# Patient Record
Sex: Female | Born: 1980
Health system: Southern US, Community
[De-identification: ages and names within clinical notes are randomized; demographics above are authoritative.]

## PROBLEM LIST (undated history)

## (undated) DIAGNOSIS — D649 Anemia, unspecified: Secondary | ICD-10-CM

## (undated) HISTORY — PX: LIPOSUCTION: SHX10

## (undated) HISTORY — DX: Anemia, unspecified: D64.9

---

## 2013-04-03 DIAGNOSIS — Z833 Family history of diabetes mellitus: Secondary | ICD-10-CM | POA: Insufficient documentation

## 2018-11-25 ENCOUNTER — Other Ambulatory Visit (HOSPITAL_COMMUNITY)
Admission: RE | Admit: 2018-11-25 | Discharge: 2018-11-25 | Disposition: A | Discharge status: Home / Self Care | Payer: BLUE CROSS/BLUE SHIELD | Source: Ambulatory Visit | Attending: Family Medicine | Admitting: Family Medicine

## 2018-11-25 ENCOUNTER — Ambulatory Visit (INDEPENDENT_AMBULATORY_CARE_PROVIDER_SITE_OTHER): Payer: BLUE CROSS/BLUE SHIELD | Admitting: Family Medicine

## 2018-11-25 ENCOUNTER — Other Ambulatory Visit: Payer: Self-pay | Admitting: *Deleted

## 2018-11-25 ENCOUNTER — Encounter: Payer: Self-pay | Admitting: Family Medicine

## 2018-11-25 ENCOUNTER — Other Ambulatory Visit: Payer: Self-pay

## 2018-11-25 VITALS — BP 128/80 | HR 73 | Temp 98.1°F | Resp 16 | Ht 65.0 in | Wt 211.6 lb

## 2018-11-25 DIAGNOSIS — Z0000 Encounter for general adult medical examination without abnormal findings: Secondary | ICD-10-CM | POA: Insufficient documentation

## 2018-11-25 DIAGNOSIS — Z124 Encounter for screening for malignant neoplasm of cervix: Secondary | ICD-10-CM | POA: Insufficient documentation

## 2018-11-25 DIAGNOSIS — Z23 Encounter for immunization: Secondary | ICD-10-CM

## 2018-11-25 LAB — COMPREHENSIVE METABOLIC PANEL
ALT: 14 U/L (ref 0–35)
AST: 16 U/L (ref 0–37)
Albumin: 4 g/dL (ref 3.5–5.2)
Alkaline Phosphatase: 55 U/L (ref 39–117)
BUN: 10 mg/dL (ref 6–23)
CO2: 28 mEq/L (ref 19–32)
Calcium: 9.2 mg/dL (ref 8.4–10.5)
Chloride: 103 mEq/L (ref 96–112)
Creatinine, Ser: 0.77 mg/dL (ref 0.40–1.20)
GFR: 101.76 mL/min (ref >60.00)
Glucose, Bld: 88 mg/dL (ref 70–99)
Potassium: 4 mEq/L (ref 3.5–5.1)
Sodium: 136 mEq/L (ref 135–145)
Total Bilirubin: 0.4 mg/dL (ref 0.2–1.2)
Total Protein: 7.2 g/dL (ref 6.0–8.3)

## 2018-11-25 LAB — CBC WITH DIFFERENTIAL/PLATELET
Basophils Absolute: 0.1 10*3/uL (ref 0.0–0.1)
Basophils Relative: 1.1 % (ref 0.0–3.0)
Eosinophils Absolute: 0.1 10*3/uL (ref 0.0–0.7)
Eosinophils Relative: 2.6 % (ref 0.0–5.0)
HCT: 37.6 % (ref 36.0–46.0)
Hemoglobin: 12.5 g/dL (ref 12.0–15.0)
Lymphocytes Relative: 28.5 % (ref 12.0–46.0)
Lymphs Abs: 1.6 10*3/uL (ref 0.7–4.0)
MCHC: 33.2 g/dL (ref 30.0–36.0)
MCV: 87.4 fl (ref 78.0–100.0)
Monocytes Absolute: 0.7 10*3/uL (ref 0.1–1.0)
Monocytes Relative: 11.7 % (ref 3.0–12.0)
Neutro Abs: 3.2 10*3/uL (ref 1.4–7.7)
Neutrophils Relative %: 56.1 % (ref 43.0–77.0)
Platelets: 267 10*3/uL (ref 150.0–400.0)
RBC: 4.3 Mil/uL (ref 3.87–5.11)
RDW: 15.1 % (ref 11.5–15.5)
WBC: 5.7 10*3/uL (ref 4.0–10.5)

## 2018-11-25 LAB — LIPID PANEL
Cholesterol: 171 mg/dL (ref 0–200)
HDL: 57.2 mg/dL (ref >39.00)
LDL Cholesterol: 94 mg/dL (ref 0–99)
NonHDL: 113.69
Total CHOL/HDL Ratio: 3
Triglycerides: 100 mg/dL (ref 0.0–149.0)
VLDL: 20 mg/dL (ref 0.0–40.0)

## 2018-11-25 LAB — TSH: TSH: 1.03 u[IU]/mL (ref 0.35–4.50)

## 2018-11-25 NOTE — Patient Instructions (Signed)
It was so good seeing you again! Thank you for establishing with my new practice and allowing me to continue caring for you. It means a lot to me.   Please schedule a follow up appointment with me in 12 months for your annual complete physical; please come fasting.  Recommendations for women to keep healthy:   EXERCISE AND DIET: We recommended that you start or continue a regular exercise program for good health. Regular exercise means any activity that makes your heart beat faster and makes you sweat. We recommend exercising at least 30 minutes per day at least 3 days a week, preferably 4 or 5. We also recommend a diet low in fat and sugar. Inactivity, poor dietary choices and obesity can cause diabetes, heart attack, stroke, and kidney damage, among others.   ALCOHOL AND SMOKING: Women should limit their alcohol intake to no more than 7 drinks/beers/glasses of wine (combined, not each!) per week. Moderation of alcohol intake to this level decreases your risk of breast cancer and liver damage. And of course, no recreational drugs are part of a healthy lifestyle. And absolutely no smoking or even second hand smoke. Most people know smoking can cause heart and lung diseases, but did you know it also contributes to weakening of your bones? Aging of your skin? Yellowing of your teeth and nails?  CALCIUM AND VITAMIN D: Adequate intake of calcium and Vitamin D are recommended. The recommendations for exact amounts of these supplements seem to change often, but generally speaking 600 mg of calcium (either carbonate or citrate) and 800 units of Vitamin D per day seems prudent. Certain women may benefit from higher intake of Vitamin D. If you are among these women, your doctor will have told you during your visit.   PAP SMEARS: Pap smears, to check for cervical cancer or precancers, have traditionally been done yearly, although recent scientific advances have shown that most women can have pap smears less often.  However, every woman still should have a physical exam from her gynecologist every year. It will include a breast check, inspection of the vulva and vagina to check for abnormal growths or skin changes, a visual exam of the cervix, and then an exam to evaluate the size and shape of the uterus and ovaries. And after 38 years of age, a rectal exam is indicated to check for rectal cancers. We will also provide age appropriate advice regarding health maintenance, like when you should have certain vaccines, screening for sexually transmitted diseases, bone density testing, colonoscopy, mammograms, etc.   MAMMOGRAMS: All women over 53 years old should have a yearly mammogram. Many facilities now offer a "3D" mammogram, which may cost around $50 extra out of pocket. If possible, we recommend you accept the option to have the 3D mammogram performed. It both reduces the number of women who will be called back for extra views which then turn out to be normal, and it is better than the routine mammogram at detecting truly abnormal areas.   COLONOSCOPY: Colonoscopy to screen for colon cancer is recommended for all women at age 32. We know, you hate the idea of the prep. We agree, BUT, having colon cancer and not knowing it is worse!! Colon cancer so often starts as a polyp that can be seen and removed at colonscopy, which can quite literally save your life! And if your first colonoscopy is normal and you have no family history of colon cancer, most women don't have to have it again for  10 years. Once every ten years, you can do something that may end up saving your life, right? We will be happy to help you get it scheduled when you are ready. Be sure to check your insurance coverage so you understand how much it will cost. It may be covered as a preventative service at no cost, but you should check your particular policy.
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
## 2018-11-27 LAB — HIV ANTIBODY (ROUTINE TESTING W REFLEX): HIV 1&2 Ab, 4th Generation: NONREACTIVE

## 2018-12-01 LAB — CYTOLOGY - PAP
Diagnosis: NEGATIVE
HPV 16/18/45 genotyping: NEGATIVE
HPV: DETECTED — AB

## 2018-12-01 NOTE — Progress Notes (Signed)
Please call patient: I have reviewed his/her lab results. Please let her know that all of her blood test results and pap smear results are normal.   (Nl pap and neg HR HPV. Recheck 5 years. )

## 2019-12-12 ENCOUNTER — Encounter: Payer: Self-pay | Admitting: Family Medicine

## 2019-12-13 NOTE — Telephone Encounter (Signed)
LVM for patient to call back and schedule a Weight loss opt with Dr. Mardelle Matte

## 2019-12-25 ENCOUNTER — Telehealth: Payer: Self-pay | Admitting: Family Medicine

## 2019-12-25 NOTE — Telephone Encounter (Signed)
Patient calling, Has an appointment for a physical on 02/21/2020 but would like to ask the nurse about some weight loss issues. Please Advise

## 2019-12-26 NOTE — Telephone Encounter (Signed)
Patient has been scheduled

## 2019-12-26 NOTE — Telephone Encounter (Signed)
Please schedule patient for 11 AM on 01/12/20. Patient is aware of appt

## 2020-01-12 ENCOUNTER — Other Ambulatory Visit: Payer: Self-pay

## 2020-01-12 ENCOUNTER — Ambulatory Visit (INDEPENDENT_AMBULATORY_CARE_PROVIDER_SITE_OTHER): Payer: Managed Care, Other (non HMO) | Admitting: Family Medicine

## 2020-01-12 ENCOUNTER — Encounter: Payer: Self-pay | Admitting: Family Medicine

## 2020-01-12 VITALS — BP 128/80 | HR 83 | Temp 98.2°F | Resp 15 | Ht 65.0 in | Wt 216.6 lb

## 2020-01-12 DIAGNOSIS — E669 Obesity, unspecified: Secondary | ICD-10-CM | POA: Insufficient documentation

## 2020-01-12 MED ORDER — PHENTERMINE HCL 37.5 MG PO TABS: 37.5000 mg | ORAL_TABLET | Freq: Every day | ORAL | 2 refills | Status: DC

## 2020-01-12 NOTE — Progress Notes (Signed)
Subjective  CC:  Chief Complaint  Patient presents with  . weight management    interested in starting Qsymia    HPI: Ashley Bartlett is a 39 y.o. female who presents to the office today to address the problems listed above in the chief complaint.  Very pleasant 39 yo struggling with weight loss. 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 Has gained a few pounds. Now is home and taking some time off to recover from the stress/work. Ready to work on self care and weight loss. Would like to start a medication to help. Has used phentermine in past with success. Friend has used qsymia with some success as well. No HTN or DM and wants to avoid getting these problems. Starting with some protein meal replacement shakes; starting to exercise at [employer] 3x/week.    Wt Readings from Last 3 Encounters:  01/12/20 216 lb 9.6 oz (98.2 kg)  11/25/18 211 lb 9.6 oz (96 kg)    Assessment  1. Obesity (BMI 30-39.9)      Plan   obesity:  Discussed nutrition, exercise goals, and starting meds: elect phentermine 37.5. has f/u in 6 weeks for cpe. Will recheck weight loss and tolerance then. Discussed risks/benefits and expectations.   Follow up: Return for as scheduled.  02/21/2020  No orders of the defined types were placed in this encounter.  Meds ordered this encounter  Medications  . phentermine (ADIPEX-P) 37.5 MG tablet    Sig: Take 1 tablet (37.5 mg total) by mouth daily before breakfast.    Dispense:  30 tablet    Refill:  2      I reviewed the patients updated PMH, FH, and SocHx.    Patient Active Problem List   Diagnosis Date Noted  . Obesity (BMI 30-39.9) 01/12/2020  . Family history of diabetes mellitus in father 04/03/2013   No outpatient medications have been marked as taking for the 01/12/20 encounter (Office Visit) with Leamon Arnt, MD.    Allergies: Patient has No Known Allergies. Family History: Patient family history includes Asthma in her  sister; Breast cancer (age of onset: 41) in her sister; Diabetes in her father; Diabetes type I in her daughter; Healthy in her brother, brother, brother, daughter, and sister; Hepatitis C in her mother; Hypertension in her father, mother, and sister. Social History:  Patient  reports that she has never smoked. She has never used smokeless tobacco. She reports that she does not drink alcohol or use drugs.  Review of Systems: Constitutional: Negative for fever malaise or anorexia Cardiovascular: negative for chest pain Respiratory: negative for SOB or persistent cough Gastrointestinal: negative for abdominal pain  Objective  Vitals: BP 128/80   Pulse 83   Temp 98.2 F (36.8 C) (Temporal)   Resp 15   Ht 5\' 5"  (1.651 m)   Wt 216 lb 9.6 oz (98.2 kg)   SpO2 98%   BMI 36.04 kg/m  General: no acute distress , A&Ox3   Commons side effects, risks, benefits, and alternatives for medications and treatment plan prescribed today were discussed, and the patient expressed understanding of the given instructions. Patient is instructed to call or message via MyChart if he/she has any questions or concerns regarding our treatment plan. No barriers to understanding were identified. We discussed Red Flag symptoms and signs in detail. Patient expressed understanding regarding what to do in case of urgent or emergency type symptoms.   Medication list was reconciled,  printed and provided to the patient in AVS. Patient instructions and summary information was reviewed with the patient as documented in the AVS. This note was prepared with assistance of Dragon voice recognition software. Occasional wrong-word or sound-a-like substitutions may have occurred due to the inherent limitations of voice recognition software  This visit occurred during the SARS-CoV-2 public health emergency.  Safety protocols were in place, including screening questions prior to the visit, additional usage of staff PPE, and extensive  cleaning of exam room while observing appropriate contact time as indicated for disinfecting solutions.

## 2020-01-12 NOTE — Patient Instructions (Addendum)
Please follow up as scheduled for your next visit with me: 02/21/2020   If you have any questions or concerns, please don't hesitate to send me a message via MyChart or call the office at [phone]. Thank you for visiting with [place] today! It's our pleasure caring for you.  Start phentermine for appetite suppression; start working on changing behaviors around food: shopping, meal prep, planning ahead and eating mostly plants. Avoid processed foods as much as possible. Drink water.

## 2020-02-21 ENCOUNTER — Other Ambulatory Visit (HOSPITAL_COMMUNITY)
Admission: RE | Admit: 2020-02-21 | Discharge: 2020-02-21 | Disposition: A | Discharge status: Home / Self Care | Payer: Managed Care, Other (non HMO) | Source: Ambulatory Visit | Attending: Family Medicine | Admitting: Family Medicine

## 2020-02-21 ENCOUNTER — Other Ambulatory Visit: Payer: Self-pay

## 2020-02-21 ENCOUNTER — Ambulatory Visit (INDEPENDENT_AMBULATORY_CARE_PROVIDER_SITE_OTHER): Payer: Managed Care, Other (non HMO) | Admitting: Family Medicine

## 2020-02-21 ENCOUNTER — Encounter: Payer: Self-pay | Admitting: Family Medicine

## 2020-02-21 VITALS — BP 118/72 | HR 101 | Temp 97.2°F | Resp 18 | Ht 65.0 in | Wt 215.6 lb

## 2020-02-21 DIAGNOSIS — Z1159 Encounter for screening for other viral diseases: Secondary | ICD-10-CM | POA: Diagnosis not present

## 2020-02-21 DIAGNOSIS — Z0000 Encounter for general adult medical examination without abnormal findings: Secondary | ICD-10-CM | POA: Diagnosis not present

## 2020-02-21 DIAGNOSIS — Z113 Encounter for screening for infections with a predominantly sexual mode of transmission: Secondary | ICD-10-CM

## 2020-02-21 DIAGNOSIS — E669 Obesity, unspecified: Secondary | ICD-10-CM | POA: Diagnosis not present

## 2020-02-21 DIAGNOSIS — Z833 Family history of diabetes mellitus: Secondary | ICD-10-CM | POA: Diagnosis not present

## 2020-02-21 DIAGNOSIS — E049 Nontoxic goiter, unspecified: Secondary | ICD-10-CM

## 2020-02-21 LAB — COMPREHENSIVE METABOLIC PANEL
ALT: 16 U/L (ref 0–35)
AST: 14 U/L (ref 0–37)
Albumin: 4 g/dL (ref 3.5–5.2)
Alkaline Phosphatase: 58 U/L (ref 39–117)
BUN: 7 mg/dL (ref 6–23)
CO2: 26 mEq/L (ref 19–32)
Calcium: 8.8 mg/dL (ref 8.4–10.5)
Chloride: 106 mEq/L (ref 96–112)
Creatinine, Ser: 0.69 mg/dL (ref 0.40–1.20)
GFR: 114.73 mL/min (ref >60.00)
Glucose, Bld: 83 mg/dL (ref 70–99)
Potassium: 4.1 mEq/L (ref 3.5–5.1)
Sodium: 138 mEq/L (ref 135–145)
Total Bilirubin: 0.3 mg/dL (ref 0.2–1.2)
Total Protein: 6.9 g/dL (ref 6.0–8.3)

## 2020-02-21 LAB — CBC WITH DIFFERENTIAL/PLATELET
Basophils Absolute: 0 10*3/uL (ref 0.0–0.1)
Basophils Relative: 0.5 % (ref 0.0–3.0)
Eosinophils Absolute: 0.1 10*3/uL (ref 0.0–0.7)
Eosinophils Relative: 2.5 % (ref 0.0–5.0)
HCT: 35.5 % — ABNORMAL LOW (ref 36.0–46.0)
Hemoglobin: 11.7 g/dL — ABNORMAL LOW (ref 12.0–15.0)
Lymphocytes Relative: 27.7 % (ref 12.0–46.0)
Lymphs Abs: 1.5 10*3/uL (ref 0.7–4.0)
MCHC: 32.9 g/dL (ref 30.0–36.0)
MCV: 85.9 fl (ref 78.0–100.0)
Monocytes Absolute: 0.5 10*3/uL (ref 0.1–1.0)
Monocytes Relative: 10.5 % (ref 3.0–12.0)
Neutro Abs: 3.1 10*3/uL (ref 1.4–7.7)
Neutrophils Relative %: 58.8 % (ref 43.0–77.0)
Platelets: 296 10*3/uL (ref 150.0–400.0)
RBC: 4.14 Mil/uL (ref 3.87–5.11)
RDW: 15.4 % (ref 11.5–15.5)
WBC: 5.2 10*3/uL (ref 4.0–10.5)

## 2020-02-21 LAB — LIPID PANEL
Cholesterol: 157 mg/dL (ref 0–200)
HDL: 54.3 mg/dL (ref >39.00)
LDL Cholesterol: 87 mg/dL (ref 0–99)
NonHDL: 102.45
Total CHOL/HDL Ratio: 3
Triglycerides: 79 mg/dL (ref 0.0–149.0)
VLDL: 15.8 mg/dL (ref 0.0–40.0)

## 2020-02-21 LAB — TSH: TSH: 1.44 u[IU]/mL (ref 0.35–4.50)

## 2020-02-21 LAB — HEMOGLOBIN A1C: Hgb A1c MFr Bld: 5.6 % (ref 4.6–6.5)

## 2020-02-21 NOTE — Patient Instructions (Addendum)
Please return in 12 months for your annual complete physical; please come fasting.  I will release your lab results to you on your MyChart account with further instructions. Please reply with any questions.   If you have any questions or concerns, please don't hesitate to send me a message via MyChart or call the office at [phone]. Thank you for visiting with [place] today! It's our pleasure caring for you.  We will call you to get your thyroid ultrasound scheduled. I'd like to ensure it is normal size and without nodules.   Please do these things to maintain good health!   Exercise at least 30-45 minutes a day,  4-5 days a week.   Eat a low-fat diet with lots of fruits and vegetables, up to 7-9 servings per day.  Drink plenty of water daily. Try to drink 8 8oz glasses per day.  Seatbelts can save your life. Always wear your seatbelt.  Place Smoke Detectors on every level of your home and check batteries every year.  Schedule an appointment with an eye doctor for an eye exam every 1-2 years  Safe sex - use condoms to protect yourself from STDs if you could be exposed to these types of infections. Use birth control if you do not want to become pregnant and are sexually active.  Avoid heavy alcohol use. If you drink, keep it to less than 2 drinks/day and not every day.  Health Care Power of Attorney.  Choose someone you trust that could speak for you if you became unable to speak for yourself.  Depression is common in our stressful world.If you're feeling down or losing interest in things you normally enjoy, please come in for a visit.  If anyone is threatening or hurting you, please get help. Physical or Emotional Violence is never OK.

## 2020-02-21 NOTE — Progress Notes (Signed)
Subjective  Chief Complaint  Patient presents with  . Annual Exam    Fasting labs, she would like to test to see if she has the covid antibodies  . Health Maintenance    She would like to get her Tdap vaccine     HPI: Ashley Bartlett is a 39 y.o. female who presents to [facility] [place] today for a Female Wellness Visit.  She also has the concerns and/or needs as listed above in the chief complaint. These will be addressed in addition to the Health Maintenance Visit.   Wellness Visit: annual visit with health maintenance review and exam without Pap   HM: overall doing well.  Pap smear screening is up-to-date.  Immunizations are up-to-date.  She is not due for Tdap but she had her last one last year. Chronic disease management visit and/or acute problem visit:  Obesity: Tolerating phentermine very well.  Started about 6 weeks ago.  No adverse side effects.  Weight is down 1 to 2 pounds and she is happy with that.  Eating a healthy diet and exercising regularly.  Family history of diabetes: No symptoms of hyperglycemia present  Sexually active, using condoms for birth control.  Declines other forms of birth control at this time.  Would like to have STD screening done.  No known exposures.  No symptoms.  Menstrual cycles are regular  Wt Readings from Last 3 Encounters:  02/21/20 215 lb 9.6 oz (97.8 kg)  01/12/20 216 lb 9.6 oz (98.2 kg)  11/25/18 211 lb 9.6 oz (96 kg)    Assessment  1. Annual physical exam   2. Obesity (BMI 30-39.9)   3. Family history of diabetes mellitus in father   72. Need for hepatitis C screening test   5. Screen for STD (sexually transmitted disease)   6. Enlarged thyroid      Plan  Female Wellness Visit:  Age appropriate Health Maintenance and Prevention measures were discussed with patient. Included topics are cancer screening recommendations, ways to keep healthy (see AVS) including dietary and exercise recommendations, regular  eye and dental care, use of seat belts, and avoidance of moderate alcohol use and tobacco use.   BMI: discussed patient's BMI and encouraged positive lifestyle modifications to help get to or maintain a target BMI.  HM needs and immunizations were addressed and ordered. See below for orders. See HM and immunization section for updates.  Routine labs and screening tests ordered including cmp, cbc and lipids where appropriate.  Discussed recommendations regarding Vit D and calcium supplementation (see AVS)  Chronic disease f/u and/or acute problem visit: (deemed necessary to be done in addition to the wellness visit):  Obesity: Continue phentermine.  Continue healthy diet and exercise  Screen for STDs: Safe sex and birth control options discussed  Screen for diabetes  Possibly enlarged thyroid on clinical exam without symptoms: Check ultrasound  Follow up: 12 months for your complete annual physical exam with blood work. Please come fasting.  Orders Placed This Encounter  Procedures  . US THYROID  . CBC with Differential/Platelet  . Comprehensive metabolic panel  . Lipid panel  . TSH  . Hemoglobin A1c  . Hepatitis C antibody  . HIV Antibody (routine testing w rflx)  . RPR   No orders of the defined types were placed in this encounter.     Lifestyle: Body mass index is 35.88 kg/m. Wt Readings from Last 3 Encounters:  02/21/20 215 lb 9.6 oz (97.8 kg)  01/12/20 216 lb 9.6 oz (98.2 kg)  11/25/18 211 lb 9.6 oz (96 kg)    Patient Active Problem List   Diagnosis Date Noted  . Obesity (BMI 30-39.9) 01/12/2020  . Family history of diabetes mellitus in father 04/03/2013   Health Maintenance  Topic Date Due  . Hepatitis C Screening  Never done  . INFLUENZA VACCINE  03/10/2020  . PAP SMEAR-Modifier  11/25/2023  . TETANUS/TDAP  11/24/2028  . COVID-19 Vaccine  Completed  . HIV Screening  Completed   Immunization History  Administered Date(s) Administered  . PFIZER  SARS-COV-2 Vaccination 11/08/2019, 12/06/2019  . PPD Test 04/03/2013, 08/29/2014  . Tdap 11/09/2011, 11/25/2018   We updated and reviewed the patient's past history in detail and it is documented below. Allergies: Patient  reports no history of alcohol use. Past Medical History Patient  has a past medical history of Anemia. Past Surgical History Patient  has a past surgical history that includes Liposuction. Social History   Socioeconomic History  . Marital status: Divorced    Spouse name: Not on file  . Number of children: 2  . Years of education: Not on file  . Highest education level: Not on file  Occupational History  . Occupation: Designer, jewellery    Comment: works in [place], [place]  Tobacco Use  . Smoking status: Never Smoker  . Smokeless tobacco: Never Used  Vaping Use  . Vaping Use: Never used  Substance and Sexual Activity  . Alcohol use: Never  . Drug use: Never  . Sexual activity: Not Currently    Comment: has used mirena, ocps, paraguard in past  Other Topics Concern  . Not on file  Social History Narrative  . Not on file   Social Determinants of Health   Financial Resource Strain:   . Difficulty of Paying Living Expenses:   Food Insecurity:   . Worried About Programme researcher, broadcasting/film/video in the Last Year:   . Barista in the Last Year:   Transportation Needs:   . Freight forwarder (Medical):   Marland Kitchen Lack of Transportation (Non-Medical):   Physical Activity:   . Days of Exercise per Week:   . Minutes of Exercise per Session:   Stress:   . Feeling of Stress :   Social Connections:   . Frequency of Communication with Friends and Family:   . Frequency of Social Gatherings with Friends and Family:   . Attends Religious Services:   . Active Member of Clubs or Organizations:   . Attends Banker Meetings:   Marland Kitchen Marital Status:    Family History  Problem Relation Age of Onset  . Hypertension Mother   . Hepatitis C Mother   . Diabetes Father     . Hypertension Father   . Hypertension Sister   . Healthy Brother   . Healthy Daughter   . Diabetes type I Daughter   . Healthy Sister   . Breast cancer Sister 62  . Asthma Sister   . Healthy Brother   . Healthy Brother     Review of Systems: Constitutional: negative for fever or malaise Ophthalmic: negative for photophobia, double vision or loss of vision Cardiovascular: negative for chest pain, dyspnea on exertion, or new LE swelling Respiratory: negative for SOB or persistent cough Gastrointestinal: negative for abdominal pain, change in bowel habits or melena Genitourinary: negative for dysuria or gross hematuria, no abnormal uterine bleeding or disharge Musculoskeletal: negative for new gait disturbance or  muscular weakness Integumentary: negative for new or persistent rashes, no breast lumps Neurological: negative for TIA or stroke symptoms Psychiatric: negative for SI or delusions Allergic/Immunologic: negative for hives  Patient Care Team    Relationship Specialty Notifications Start End  Willow Ora, MD PCP - General Family Medicine  11/25/18     Objective  Vitals: BP 118/72   Pulse (!) 101   Temp (!) 97.2 F (36.2 C) (Temporal)   Resp 18   Ht 5\' 5"  (1.651 m)   Wt 215 lb 9.6 oz (97.8 kg)   SpO2 97%   BMI 35.88 kg/m  General:  Well developed, well nourished, no acute distress  Psych:  Alert and orientedx3,normal mood and affect HEENT:  Normocephalic, atraumatic, non-icteric sclera, PERRL, supple neck without adenopathy, mass + generalized thyromegaly w/o nodule or ttp Cardiovascular:  Normal S1, S2, RRR without gallop, rub or murmur Respiratory:  Good breath sounds bilaterally, CTAB with normal respiratory effort Gastrointestinal: normal bowel sounds, soft, non-tender, no noted masses. No HSM MSK: no deformities, contusions. Joints are without erythema or swelling.  Skin:  Warm, no rashes or suspicious lesions noted Neurologic:    Mental status is normal.  Gross motor and sensory exams are normal. Normal gait. No tremor Breast Exam: No mass, skin retraction or nipple discharge is appreciated in either breast. No axillary adenopathy. Fibrocystic changes are not noted      Commons side effects, risks, benefits, and alternatives for medications and treatment plan prescribed today were discussed, and the patient expressed understanding of the given instructions. Patient is instructed to call or message via MyChart if he/she has any questions or concerns regarding our treatment plan. No barriers to understanding were identified. We discussed Red Flag symptoms and signs in detail. Patient expressed understanding regarding what to do in case of urgent or emergency type symptoms.   Medication list was reconciled, printed and provided to the patient in AVS. Patient instructions and summary information was reviewed with the patient as documented in the AVS. This note was prepared with assistance of Dragon voice recognition software. Occasional wrong-word or sound-a-like substitutions may have occurred due to the inherent limitations of voice recognition software  This visit occurred during the SARS-CoV-2 public health emergency.  Safety protocols were in place, including screening questions prior to the visit, additional usage of staff PPE, and extensive cleaning of exam room while observing appropriate contact time as indicated for disinfecting solutions.

## 2020-02-22 LAB — RPR: RPR Ser Ql: NONREACTIVE

## 2020-02-22 LAB — HEPATITIS C ANTIBODY
Hepatitis C Ab: NONREACTIVE
SIGNAL TO CUT-OFF: 0.02 (ref <1.00)

## 2020-02-22 LAB — CERVICOVAGINAL ANCILLARY ONLY
Chlamydia: NEGATIVE
Comment: NEGATIVE
Comment: NEGATIVE
Comment: NORMAL
Neisseria Gonorrhea: NEGATIVE
Trichomonas: NEGATIVE

## 2020-02-22 LAB — HIV ANTIBODY (ROUTINE TESTING W REFLEX): HIV 1&2 Ab, 4th Generation: NONREACTIVE

## 2020-03-06 ENCOUNTER — Ambulatory Visit
Admission: RE | Admit: 2020-03-06 | Discharge: 2020-03-06 | Disposition: A | Discharge status: Home / Self Care | Payer: Managed Care, Other (non HMO) | Source: Ambulatory Visit | Attending: Family Medicine | Admitting: Family Medicine

## 2020-03-06 ENCOUNTER — Other Ambulatory Visit: Payer: Self-pay

## 2020-03-06 DIAGNOSIS — E049 Nontoxic goiter, unspecified: Secondary | ICD-10-CM

## 2020-03-08 ENCOUNTER — Encounter: Payer: Self-pay | Admitting: Family Medicine

## 2020-03-08 DIAGNOSIS — E042 Nontoxic multinodular goiter: Secondary | ICD-10-CM

## 2020-03-08 HISTORY — DX: Nontoxic multinodular goiter: E04.2

## 2020-03-11 ENCOUNTER — Other Ambulatory Visit: Payer: Self-pay

## 2020-03-11 DIAGNOSIS — E049 Nontoxic goiter, unspecified: Secondary | ICD-10-CM

## 2020-06-06 ENCOUNTER — Encounter: Payer: Self-pay | Admitting: Family Medicine

## 2021-04-29 ENCOUNTER — Other Ambulatory Visit: Payer: Self-pay

## 2021-04-29 ENCOUNTER — Encounter: Payer: Self-pay | Admitting: Family Medicine

## 2021-04-29 DIAGNOSIS — E049 Nontoxic goiter, unspecified: Secondary | ICD-10-CM

## 2021-04-29 NOTE — Telephone Encounter (Signed)
Please order thyroid ultrasound for thyroid nodules (write that pt never had bx done) and refer to endocrine. Thanks.

## 2021-04-29 NOTE — Telephone Encounter (Signed)
Orders have been placed.

## 2021-05-01 ENCOUNTER — Ambulatory Visit
Admission: RE | Admit: 2021-05-01 | Discharge: 2021-05-01 | Disposition: A | Discharge status: Home / Self Care | Payer: No Typology Code available for payment source | Source: Ambulatory Visit | Attending: Family Medicine | Admitting: Family Medicine

## 2021-05-01 DIAGNOSIS — E049 Nontoxic goiter, unspecified: Secondary | ICD-10-CM

## 2021-05-19 NOTE — Progress Notes (Signed)
Name: Ashley Bartlett  MRN/ DOB: [id number], February 22, 1981    Age/ Sex: 40 y.o., female    PCP: Ashley Ora, MD   Reason for Endocrinology Evaluation: MNG     Date of Initial Endocrinology Evaluation: 05/20/2021     HPI: Ms. Ashley Bartlett is a 40 y.o. female with a past medical history of MNG. The patient presented for initial endocrinology clinic visit on 05/20/2021 for consultative assistance with her MNG.   She has been diagnosed with MNG in 2021 during physical exam , which prompted a thyroid ultrasound revealing a left inferior nodule meeting FNA criteria, pt  was busy and did not proceed with FNA    She had a repeat ultrasound this year with the left nodule meeting FNA criteria   She denies local neck symptoms  Denies local neck symptoms  Denies loose stools or diarrhea  Denies hand tremors    Sister with Thyroid cancer    HISTORY:  Past Medical History:  Past Medical History:  Diagnosis Date   Anemia    Multinodular goiter 03/08/2020   Enlarged thyroid on exam 02/2020: ultrasound reveals nodules; one large enough to warrant biopsy: referred to endocrine.    Past Surgical History:  Past Surgical History:  Procedure Laterality Date   LIPOSUCTION     2016 [place]    Social History:  reports that she has never smoked. She has never used smokeless tobacco. She reports that she does not drink alcohol and does not use drugs. Family History: family history includes Asthma in her sister; Breast cancer (age of onset: 35) in her sister; Diabetes in her father; Diabetes type I in her daughter; Healthy in her brother, brother, brother, daughter, and sister; Hepatitis C in her mother; Hypertension in her father, mother, and sister.   HOME MEDICATIONS: Allergies as of 05/20/2021   No Known Allergies      Medication List        Accurate as of May 20, 2021  1:23 PM. If you have any questions, ask your nurse or doctor.          STOP taking these  medications    phentermine 37.5 MG tablet Commonly known as: ADIPEX-P Stopped by: Ashley Shorts, MD          REVIEW OF SYSTEMS: A comprehensive ROS was conducted with the patient and is negative except as per HPI    OBJECTIVE:  VS: BP 110/72 (BP Location: Left Arm, Patient Position: Sitting, Cuff Size: Large)   Pulse 84   Ht 5\' 5"  (1.651 m)   Wt 210 lb (95.3 kg)   SpO2 96%   BMI 34.95 kg/m    Wt Readings from Last 3 Encounters:  05/20/21 210 lb (95.3 kg)  02/21/20 215 lb 9.6 oz (97.8 kg)  01/12/20 216 lb 9.6 oz (98.2 kg)     EXAM: General: Pt appears well and is in NAD  Eyes: External eye exam normal without stare, lid lag or exophthalmos.  EOM intact.    Neck: General: Supple without adenopathy. Thyroid: Bilateral nodules appreciated   Lungs: Clear with good BS bilat with no rales, rhonchi, or wheezes  Heart: Auscultation: RRR.  Abdomen: Normoactive bowel sounds, soft, nontender, without masses or organomegaly palpable  Extremities:  BL LE: No pretibial edema normal ROM and strength.  Skin: Hair: Texture and amount normal with gender appropriate distribution Skin Inspection: No rashes Skin Palpation: Skin temperature, texture, and thickness normal to palpation  Neuro: Cranial nerves:  II - XII grossly intact  Motor: Normal strength throughout DTRs: 2+ and symmetric in UE without delay in relaxation phase  Mental Status: Judgment, insight: Intact Orientation: Oriented to time, place, and person Mood and affect: No depression, anxiety, or agitation     DATA REVIEWED:   Results for Ashley Bartlett (MRN [id number]) as of 05/20/2021 13:25  Ref. Range 02/21/2020 13:31  TSH Latest Ref Range: 0.35 - 4.50 uIU/mL 1.44    Thyroid Ultrasound 05/01/2021  Estimated total number of nodules >/= 1 cm: 3   Number of spongiform nodules >/=  2 cm not described below (TR1): 0   Number of mixed cystic and solid nodules >/= 1.5 cm not described below (TR2): 0    _________________________________________________________   Nodule # 1:   Location: Right; Inferior   Maximum size: 2.5, previously 2.1 cm; Other 2 dimensions: 1.6 x 1.5 cm   Composition: mixed cystic and solid (1)   Echogenicity: isoechoic (1)   Shape: not taller-than-wide (0)   Margins: ill-defined (0)   Echogenic foci: none (0)   ACR TI-RADS total points: 2.   ACR TI-RADS risk category: TR2 (2 points).   ACR TI-RADS recommendations:   This nodule does NOT meet TI-RADS criteria for biopsy or dedicated follow-up.   _________________________________________________________   Nodule # 2:   Location: Left; Superior   Maximum size: 2.1, previously 2.0 cm; Other 2 dimensions: 1.8 x 1.2 cm   Composition: mixed cystic and solid (1)   Echogenicity: isoechoic (1)   Shape: not taller-than-wide (0)   Margins: ill-defined (0)   Echogenic foci: none (0)   ACR TI-RADS total points: 2.   ACR TI-RADS risk category: TR2 (2 points).   ACR TI-RADS recommendations:   This nodule does NOT meet TI-RADS criteria for biopsy or dedicated follow-up.   _________________________________________________________   Nodule # 3:   Location: Left; Inferior   Maximum size: 4.6, previously 5.3 cm; Other 2 dimensions: 4.5 x 3.0 cm   Composition: solid/almost completely solid (2)   Echogenicity: isoechoic (1)   Shape: not taller-than-wide (0)   Margins: ill-defined (0)   Echogenic foci: none (0)   ACR TI-RADS total points: 3.   ACR TI-RADS risk category: TR3 (3 points).   ACR TI-RADS recommendations:   **Given size (>/= 2.5 cm) and appearance, fine needle aspiration of this mildly suspicious nodule should be considered based on TI-RADS criteria.   _________________________________________________________   Stable thyroid heterogeneity without hypervascularity or regional adenopathy.   IMPRESSION: 4.6 cm left inferior TR 3 nodule meets criteria for biopsy as  above.   Additional bilateral TR 2 nodules as detailed do not meet criteria for any biopsy or follow-up.    ASSESSMENT/PLAN/RECOMMENDATIONS:   MNG:  - No local neck symptoms  - Labs at PCP  tomorrow  - Discussed increased false positive results of FNA of nodules size > 4 cm  - Will have low threshold for thyroidectomy  - Will proceed with FNA    F/U in 1 yr or sooner pending FNA results   Signed electronically by: Lyndle Herrlich, MD  [facility]  [facility] [address] [place] [place], [place] [address] Phone: [phone] FAX: [phone]   CC: Ashley Ora, MD [address] [place] [address] Phone: [phone] Fax: [phone]   Return to Endocrinology clinic as below: Future Appointments  Date Time Provider Department Center  05/21/2021 10:00 AM Ashley Ora, MD [facility]  06/12/2021  2:30 PM  [facility] [place] [facility]  05/26/2022 10:30 AM Ashley Bartlett, Konrad Dolores, MD [facility]

## 2021-05-20 ENCOUNTER — Encounter: Payer: Self-pay | Admitting: Internal Medicine

## 2021-05-20 ENCOUNTER — Ambulatory Visit (INDEPENDENT_AMBULATORY_CARE_PROVIDER_SITE_OTHER): Payer: Self-pay | Admitting: Internal Medicine

## 2021-05-20 ENCOUNTER — Other Ambulatory Visit: Payer: Self-pay

## 2021-05-20 VITALS — BP 110/72 | HR 84 | Ht 65.0 in | Wt 210.0 lb

## 2021-05-20 DIAGNOSIS — E042 Nontoxic multinodular goiter: Secondary | ICD-10-CM

## 2021-05-21 ENCOUNTER — Ambulatory Visit (INDEPENDENT_AMBULATORY_CARE_PROVIDER_SITE_OTHER): Payer: Self-pay | Admitting: Family Medicine

## 2021-05-21 ENCOUNTER — Other Ambulatory Visit (HOSPITAL_COMMUNITY)
Admission: RE | Admit: 2021-05-21 | Discharge: 2021-05-21 | Disposition: A | Discharge status: Home / Self Care | Payer: Self-pay | Source: Ambulatory Visit | Attending: Family Medicine | Admitting: Family Medicine

## 2021-05-21 ENCOUNTER — Encounter: Payer: Self-pay | Admitting: Family Medicine

## 2021-05-21 VITALS — BP 126/70 | HR 82 | Temp 98.3°F | Ht 65.0 in | Wt 207.6 lb

## 2021-05-21 DIAGNOSIS — Z113 Encounter for screening for infections with a predominantly sexual mode of transmission: Secondary | ICD-10-CM

## 2021-05-21 DIAGNOSIS — E042 Nontoxic multinodular goiter: Secondary | ICD-10-CM

## 2021-05-21 DIAGNOSIS — E669 Obesity, unspecified: Secondary | ICD-10-CM

## 2021-05-21 DIAGNOSIS — Z803 Family history of malignant neoplasm of breast: Secondary | ICD-10-CM | POA: Insufficient documentation

## 2021-05-21 DIAGNOSIS — Z0000 Encounter for general adult medical examination without abnormal findings: Secondary | ICD-10-CM

## 2021-05-21 DIAGNOSIS — Z1231 Encounter for screening mammogram for malignant neoplasm of breast: Secondary | ICD-10-CM

## 2021-05-21 LAB — CBC WITH DIFFERENTIAL/PLATELET
Basophils Absolute: 0 10*3/uL (ref 0.0–0.1)
Basophils Relative: 0.4 % (ref 0.0–3.0)
Eosinophils Absolute: 0.1 10*3/uL (ref 0.0–0.7)
Eosinophils Relative: 2.6 % (ref 0.0–5.0)
HCT: 34.9 % — ABNORMAL LOW (ref 36.0–46.0)
Hemoglobin: 11.4 g/dL — ABNORMAL LOW (ref 12.0–15.0)
Lymphocytes Relative: 27.5 % (ref 12.0–46.0)
Lymphs Abs: 1.5 10*3/uL (ref 0.7–4.0)
MCHC: 32.7 g/dL (ref 30.0–36.0)
MCV: 84.9 fl (ref 78.0–100.0)
Monocytes Absolute: 0.5 10*3/uL (ref 0.1–1.0)
Monocytes Relative: 9.6 % (ref 3.0–12.0)
Neutro Abs: 3.2 10*3/uL (ref 1.4–7.7)
Neutrophils Relative %: 59.9 % (ref 43.0–77.0)
Platelets: 324 10*3/uL (ref 150.0–400.0)
RBC: 4.11 Mil/uL (ref 3.87–5.11)
RDW: 15.5 % (ref 11.5–15.5)
WBC: 5.4 10*3/uL (ref 4.0–10.5)

## 2021-05-21 LAB — LIPID PANEL
Cholesterol: 174 mg/dL (ref 0–200)
HDL: 66.1 mg/dL (ref >39.00)
LDL Cholesterol: 98 mg/dL (ref 0–99)
NonHDL: 108.31
Total CHOL/HDL Ratio: 3
Triglycerides: 50 mg/dL (ref 0.0–149.0)
VLDL: 10 mg/dL (ref 0.0–40.0)

## 2021-05-21 LAB — COMPREHENSIVE METABOLIC PANEL
ALT: 8 U/L (ref 0–35)
AST: 13 U/L (ref 0–37)
Albumin: 4 g/dL (ref 3.5–5.2)
Alkaline Phosphatase: 58 U/L (ref 39–117)
BUN: 10 mg/dL (ref 6–23)
CO2: 26 mEq/L (ref 19–32)
Calcium: 9.1 mg/dL (ref 8.4–10.5)
Chloride: 105 mEq/L (ref 96–112)
Creatinine, Ser: 0.8 mg/dL (ref 0.40–1.20)
GFR: 92.41 mL/min (ref >60.00)
Glucose, Bld: 85 mg/dL (ref 70–99)
Potassium: 4 mEq/L (ref 3.5–5.1)
Sodium: 138 mEq/L (ref 135–145)
Total Bilirubin: 0.3 mg/dL (ref 0.2–1.2)
Total Protein: 7.3 g/dL (ref 6.0–8.3)

## 2021-05-21 LAB — HEMOGLOBIN A1C: Hgb A1c MFr Bld: 5.7 % (ref 4.6–6.5)

## 2021-05-21 LAB — TSH: TSH: 1.5 u[IU]/mL (ref 0.35–5.50)

## 2021-05-21 NOTE — Progress Notes (Signed)
Subjective  Chief Complaint  Patient presents with   Annual Exam    HPI: Ashley Bartlett is a 40 y.o. female who presents to [facility] today for a Female Wellness Visit.  She also has the concerns and/or needs as listed above in the chief complaint. These will be addressed in addition to the Health Maintenance Visit.   Wellness Visit: annual visit with health maintenance review and exam without Pap  HM: screens are up to date. Eligible for flu vaccine. Not currently sexually x 6 months; had used condoms for [facility]; requests STD screen. No sxs. Eligible for mammo; sister w/ breast ca: remission x 3 years (premenopausal) Chronic disease management visit and/or acute problem visit: MNG: reviewed endocrine note: scheduling FNA bx of large nodule. Low threshold for thyroidectomy given high false negative rate of FNA of larger nodules. Check tsh today. Clinically euthyroid.  Obesity: working on weight loss  Assessment  1. Annual physical exam   2. Multinodular goiter   3. Obesity (BMI 30-39.9)   4. Encounter for screening mammogram for breast cancer   5. Family history of breast cancer in sister   15. Screen for STD (sexually transmitted disease)      Plan  Female Wellness Visit: Age appropriate Health Maintenance and Prevention measures were discussed with patient. Included topics are cancer screening recommendations, ways to keep healthy (see AVS) including dietary and exercise recommendations, regular eye and dental care, use of seat belts, and avoidance of moderate alcohol use and tobacco use.  BMI: discussed patient's BMI and encouraged positive lifestyle modifications to help get to or maintain a target BMI. HM needs and immunizations were addressed and ordered. See below for orders. See HM and immunization section for updates. Declines flu shot Routine labs and screening tests ordered including cmp, cbc and lipids where appropriate. Discussed recommendations  regarding Vit D and calcium supplementation (see AVS)  Chronic disease f/u and/or acute problem visit: (deemed necessary to be done in addition to the wellness visit): MNG:  see above. Follw along and check tsh Obesity: discussed weight care. Weight is down a few pounds. encouraging  Follow up: 12 mo cpe   Orders Placed This Encounter  Procedures   MM DIGITAL SCREENING BILATERAL   CBC with Differential/Platelet   Comprehensive metabolic panel   Lipid panel   Hemoglobin A1c   TSH   HIV Antibody (routine testing w rflx)   RPR   No orders of the defined types were placed in this encounter.      Body mass index is 34.55 kg/m. Wt Readings from Last 3 Encounters:  05/21/21 207 lb 9.6 oz (94.2 kg)  05/20/21 210 lb (95.3 kg)  02/21/20 215 lb 9.6 oz (97.8 kg)   Need for contraception: No, has used mirena, ocps, paraguard in past  Patient Active Problem List   Diagnosis Date Noted   Multinodular goiter 03/08/2020    Enlarged thyroid on exam 02/2020: ultrasound reveals nodules; one large enough to warrant biopsy: referred to endocrine. Pt went to endocrine 05/2021, dr. Lonzo Cloud. bx scheduled    Obesity (BMI 30-39.9) 01/12/2020   Family history of diabetes mellitus in father 04/03/2013   Health Maintenance  Topic Date Due   INFLUENZA VACCINE  11/07/2021 (Originally 03/10/2021)   PAP SMEAR-Modifier  11/25/2023   TETANUS/TDAP  11/24/2028   COVID-19 Vaccine  Completed   Hepatitis C Screening  Completed   HIV Screening  Completed   HPV VACCINES  Aged Out  Immunization History  Administered Date(s) Administered   PFIZER(Purple Top)SARS-COV-2 Vaccination 11/08/2019, 12/06/2019, 11/29/2020   PPD Test 04/03/2013, 08/29/2014   Tdap 11/09/2011, 11/25/2018   We updated and reviewed the patient's past history in detail and it is documented below. Allergies: Patient  reports no history of alcohol use. Past Medical History Patient  has a past medical history of Anemia and  Multinodular goiter (03/08/2020). Past Surgical History Patient  has a past surgical history that includes Liposuction. 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[place], [place]  Tobacco Use   Smoking status: Never   Smokeless tobacco: Never  Vaping Use   Vaping Use: Never used  Substance and Sexual Activity   Alcohol use: Never   Drug use: Never   Sexual activity: Not Currently    Comment: has used mirena, ocps, paraguard in past  Other Topics Concern   Not on file  Social History Narrative   Not on file   Social Determinants of Health   Financial Resource Strain: Not on file  Food Insecurity: Not on file  Transportation Needs: Not on file  Physical Activity: Not on file  Stress: Not on file  Social Connections: Not on file   Family History  Problem Relation Age of Onset   Hypertension Mother    Hepatitis C Mother    Diabetes Father    Hypertension Father    Hypertension Sister    Healthy Brother    Healthy Daughter    Diabetes type I Daughter    Healthy Sister    Breast cancer Sister 18   Asthma Sister    Healthy Brother    Healthy Brother     Review of Systems: Constitutional: negative for fever or malaise Ophthalmic: negative for photophobia, double vision or loss of vision Cardiovascular: negative for chest pain, dyspnea on exertion, or new LE swelling Respiratory: negative for SOB or persistent cough Gastrointestinal: negative for abdominal pain, change in bowel habits or melena Genitourinary: negative for dysuria or gross hematuria, no abnormal uterine bleeding or disharge Musculoskeletal: negative for new gait disturbance or muscular weakness Integumentary: negative for new or persistent rashes, no breast lumps Neurological:  negative for TIA or stroke symptoms Psychiatric: negative for SI or delusions Allergic/Immunologic: negative for hives  Patient Care Team    Relationship Specialty Notifications Start End  Willow Ora, MD PCP - General Family Medicine  11/25/18   Shamleffer, Konrad Dolores, MD Consulting Physician Endocrinology  05/21/21     Objective  Vitals: BP 126/70   Pulse 82   Temp 98.3 F (36.8 C) (Temporal)   Ht 5\' 5"  (1.651 m)   Wt 207 lb 9.6 oz (94.2 kg)   LMP 04/27/2021 (Approximate)   SpO2 97%   BMI 34.55 kg/m  General:  Well developed, well nourished, no acute distress  Psych:  Alert and orientedx3,normal mood and affect HEENT:  Normocephalic, atraumatic, non-icteric sclera, PERRL, supple neck without adenopathy, mass + enlarged thyroid, nontender Cardiovascular:  Normal S1, S2, RRR without gallop, rub or murmur Respiratory:  Good breath sounds bilaterally, CTAB with normal respiratory effort Gastrointestinal: normal bowel sounds, soft, non-tender, no noted masses. No HSM MSK: no deformities, contusions. Joints are without erythema or swelling.  Skin:  Warm, no rashes or suspicious lesions noted Neurologic:    Mental status is normal. Gross motor and sensory exams are normal. Normal gait. No tremor Breast Exam: pt declines    Commons side effects, risks, benefits, and alternatives for medications and treatment plan prescribed today were discussed, and the patient expressed understanding of the given instructions. Patient is instructed to call or message via MyChart if he/she has any questions or concerns regarding our treatment plan. No barriers to understanding were identified. We discussed Red Flag symptoms and signs in detail. Patient expressed understanding regarding what to do in case of urgent or emergency type symptoms.  Medication list was reconciled, printed and provided to the patient in AVS. Patient instructions and summary information was reviewed with the patient as  documented in the AVS. This note was prepared with assistance of Dragon voice recognition software. Occasional wrong-word or sound-a-like substitutions may have occurred due to the inherent limitations of voice recognition software  This visit occurred during the SARS-CoV-2 public health emergency.  Safety protocols were in place, including screening questions prior to the visit, additional usage of staff PPE, and extensive cleaning of exam room while observing appropriate contact time as indicated for disinfecting solutions.

## 2021-05-21 NOTE — Patient Instructions (Addendum)
Please return in 12 months for your annual complete physical; please come fasting.   I will release your lab results to you on your MyChart account with further instructions. Please reply with any questions.    If you have any questions or concerns, please don't hesitate to send me a message via MyChart or call the office at [phone]. Thank you for visiting with [place] today! It's our pleasure caring for you.  I have ordered a mammogram and/or bone density for you as we discussed today: [x]   Mammogram  []   Bone Density  Please call the office checked below to schedule your appointment:  [x]   [facility]      [address] [place],        [address] Wing         []   [facility]  [address] [place],  [facility]  Please do these things to maintain good health!  Exercise at least 30-45 minutes a day,  4-5 days a week.  Eat a low-fat diet with lots of fruits and vegetables, up to 7-9 servings per day. Drink plenty of water daily. Try to drink 8 8oz glasses per day. Seatbelts can save your life. Always wear your seatbelt. Place Smoke Detectors on every level of your home and check batteries every year. Schedule an appointment with an eye doctor for an eye exam every 1-2 years Safe sex - use condoms to protect yourself from STDs if you could be exposed to these types of infections. Use birth control if you do not want to become pregnant and are sexually active. Avoid heavy alcohol use. If you drink, keep it to less than 2 drinks/day and not every day. Health Care Power of Attorney.  Choose someone you trust that could speak for you if you became unable to speak for yourself. Depression is common in our stressful world.If you're feeling down or losing interest in things you normally enjoy, please come in for a visit. If anyone is threatening or hurting you, please get help. Physical or Emotional Violence is never OK.

## 2021-05-22 LAB — URINE CYTOLOGY ANCILLARY ONLY
Chlamydia: NEGATIVE
Comment: NEGATIVE
Comment: NORMAL
Neisseria Gonorrhea: NEGATIVE

## 2021-05-22 LAB — HIV ANTIBODY (ROUTINE TESTING W REFLEX): HIV 1&2 Ab, 4th Generation: NONREACTIVE

## 2021-05-22 LAB — RPR: RPR Ser Ql: NONREACTIVE

## 2021-06-12 ENCOUNTER — Other Ambulatory Visit: Payer: No Typology Code available for payment source

## 2021-07-22 ENCOUNTER — Encounter: Payer: Managed Care, Other (non HMO) | Admitting: Family Medicine

## 2021-10-17 ENCOUNTER — Encounter: Payer: Self-pay | Admitting: Family Medicine

## 2021-10-21 MED ORDER — PHENTERMINE HCL 37.5 MG PO TABS: 37.5000 mg | ORAL_TABLET | Freq: Every day | ORAL | 5 refills | Status: AC

## 2022-04-04 IMAGING — US US THYROID
1 series · 13 of 25 positions shown · non-contrast
Comparison: 03/06/2020

CLINICAL DATA: Dominant left inferior thyroid nodule

EXAM:
THYROID ULTRASOUND
TECHNIQUE: Ultrasound examination of the thyroid gland and adjacent soft
tissues was performed.

[Series 1: us thyroid · 0.07mm/px · 13 of 45 slices shown]
[im 1/45]
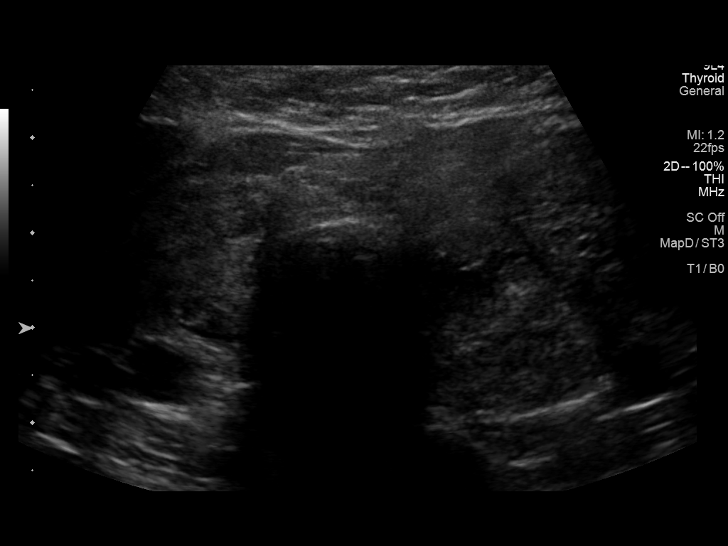
[im 4/45]
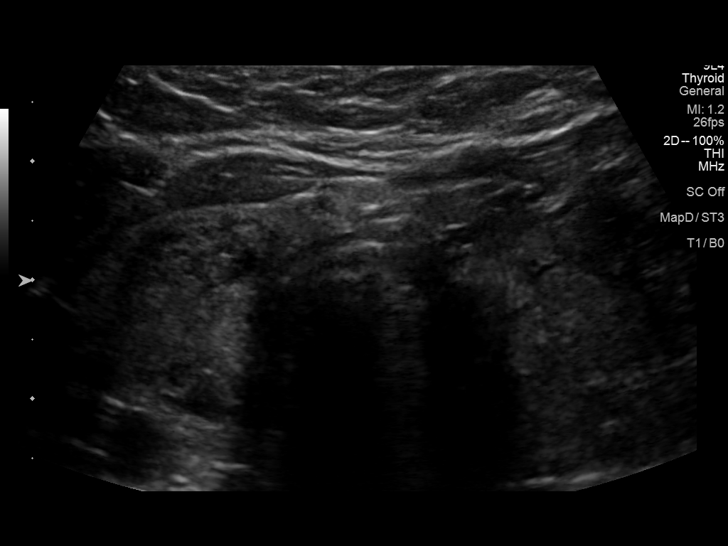
[im 8/45]
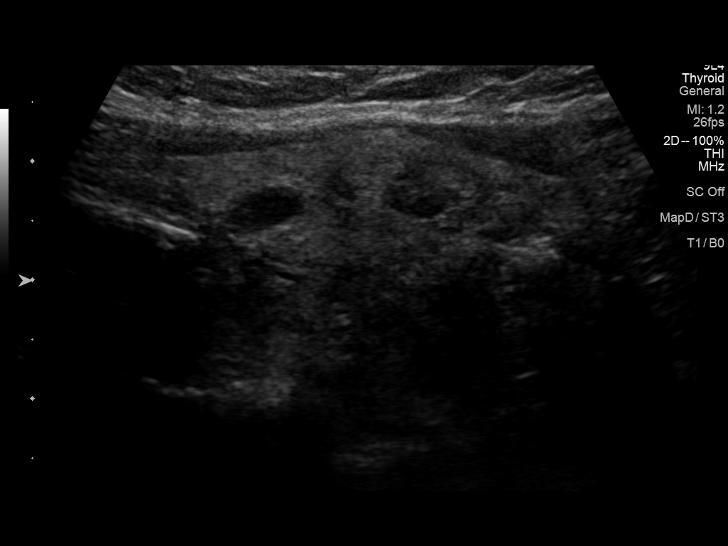
[im 12/45]
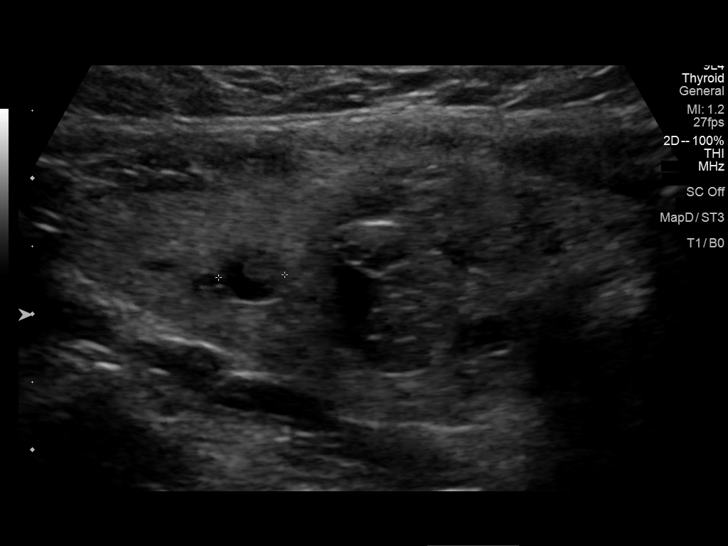
[im 15/45]
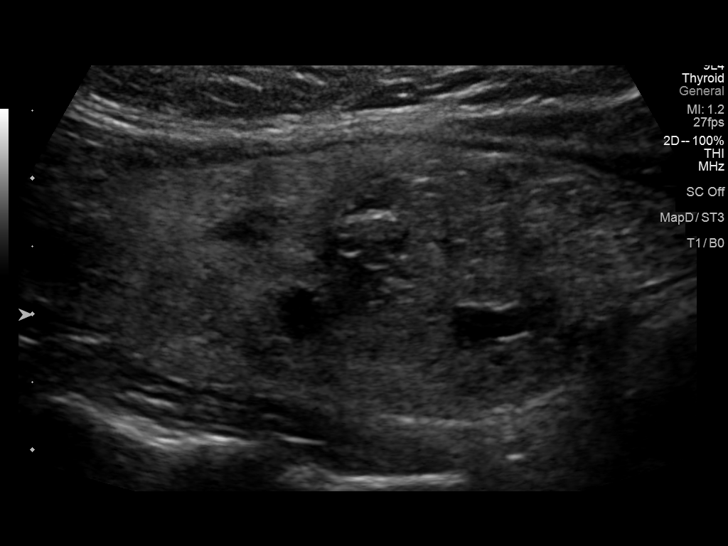
[im 19/45]
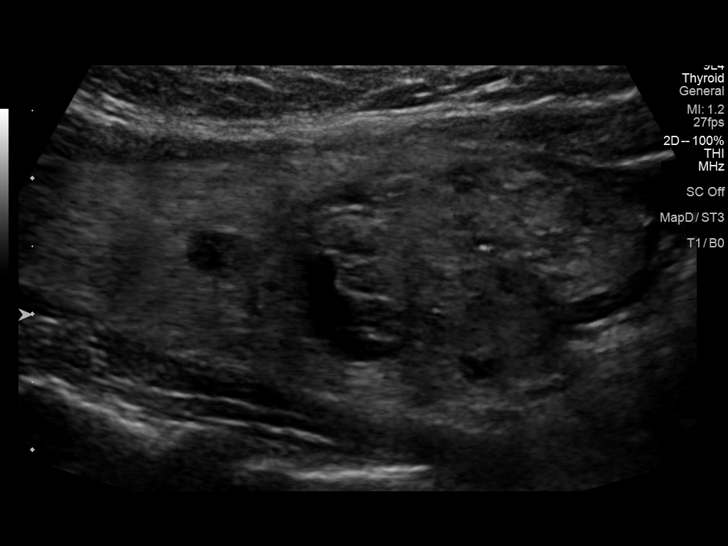
[im 23/45]
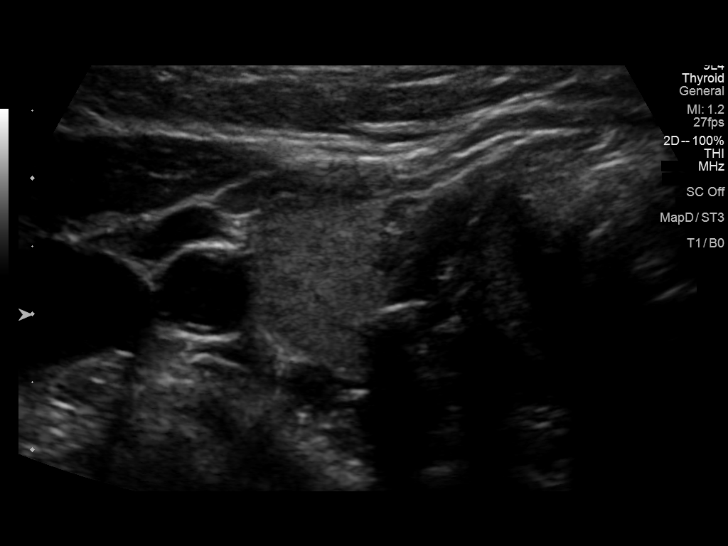
[im 26/45]
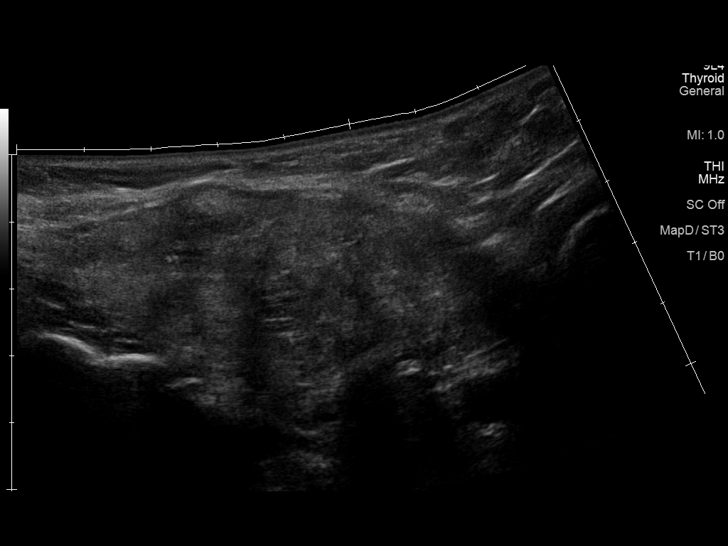
[im 30/45]
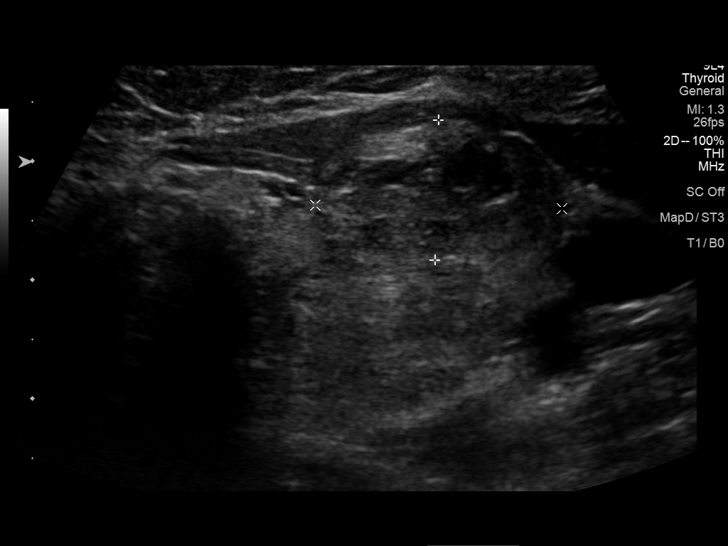
[im 34/45]
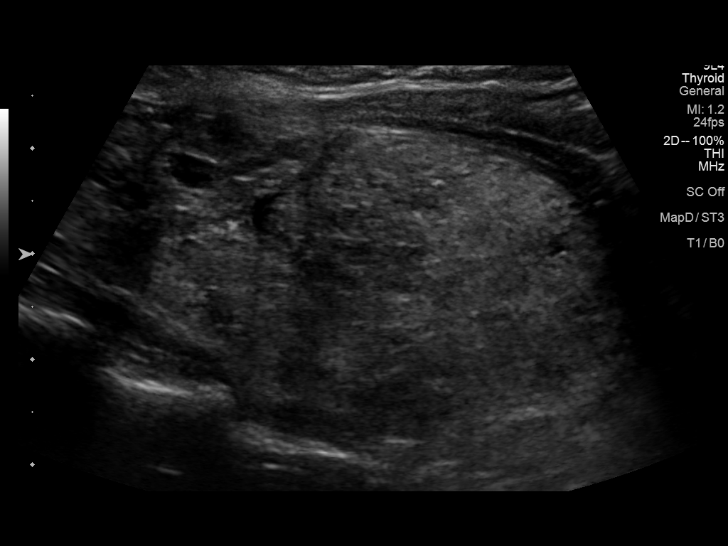
[im 37/45]
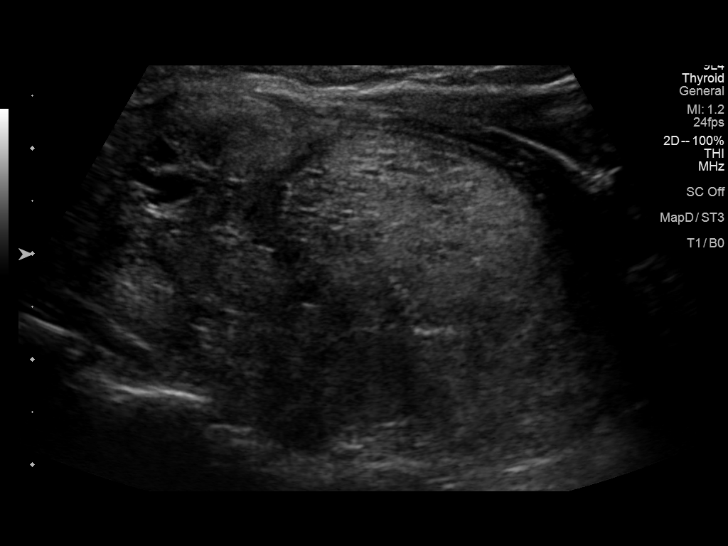
[im 41/45]
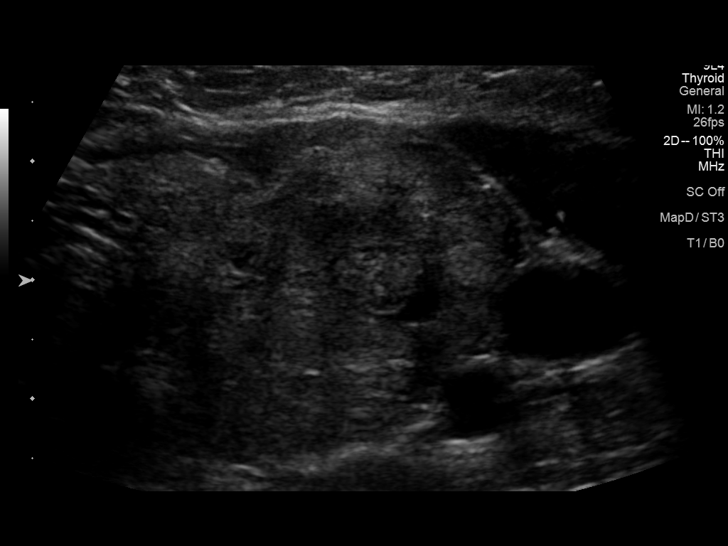
[im 45/45]
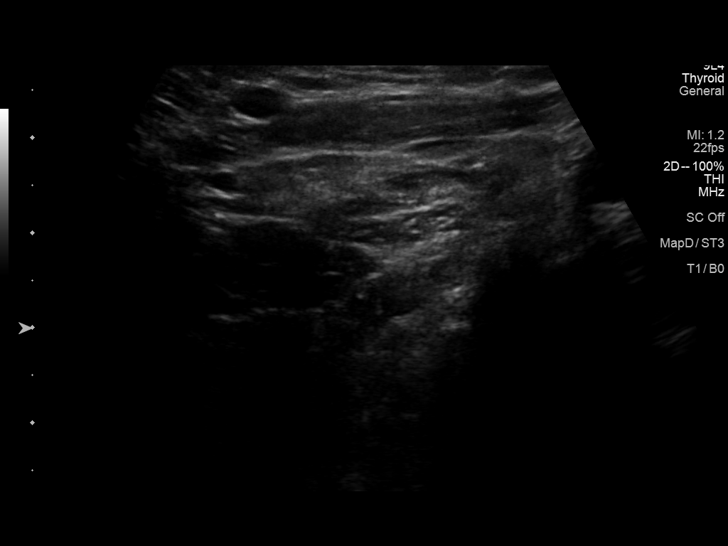

[13 of 25 positions shown; findings below may reference images not displayed]

FINDINGS: Parenchymal Echotexture: Markedly heterogenous

Isthmus: 4 mm

Right lobe: 5.2 x 2.1 x 1.5 cm

Left lobe: 7.2 x 3.3 x 3.0 cm

_________________________________________________________

Estimated total number of nodules >/= 1 cm: 3

Number of spongiform nodules >/=  2 cm not described below (TR1): 0

Number of mixed cystic and solid nodules >/= 1.5 cm not described
below (TR2): 0

_________________________________________________________

Nodule # 1:

Location: Right; Inferior

Maximum size: 2.5, previously 2.1 cm; Other 2 dimensions: 1.6 x
cm

Composition: mixed cystic and solid (1)

Echogenicity: isoechoic (1)

Shape: not taller-than-wide (0)

Margins: ill-defined (0)

Echogenic foci: none (0)

ACR TI-RADS total points: 2.

ACR TI-RADS risk category: TR2 (2 points).

ACR TI-RADS recommendations:

This nodule does NOT meet TI-RADS criteria for biopsy or dedicated
follow-up.

_________________________________________________________

Nodule # 2:

Location: Left; Superior

Maximum size: 2.1, previously 2.0 cm; Other 2 dimensions: 1.8 x
cm

Composition: mixed cystic and solid (1)

Echogenicity: isoechoic (1)

Shape: not taller-than-wide (0)

Margins: ill-defined (0)

Echogenic foci: none (0)

ACR TI-RADS total points: 2.

ACR TI-RADS risk category: TR2 (2 points).

ACR TI-RADS recommendations:

This nodule does NOT meet TI-RADS criteria for biopsy or dedicated
follow-up.

_________________________________________________________

Nodule # 3:

Location: Left; Inferior

Maximum size: 4.6, previously 5.3 cm; Other 2 dimensions: 4.5 x
cm

Composition: solid/almost completely solid (2)

Echogenicity: isoechoic (1)

Shape: not taller-than-wide (0)

Margins: ill-defined (0)

Echogenic foci: none (0)

ACR TI-RADS total points: 3.

ACR TI-RADS risk category: TR3 (3 points).

ACR TI-RADS recommendations:

**Given size (>/= 2.5 cm) and appearance, fine needle aspiration of
this mildly suspicious nodule should be considered based on TI-RADS
criteria.

_________________________________________________________

Stable thyroid heterogeneity without hypervascularity or regional
adenopathy.
IMPRESSION: 4.6 cm left inferior TR 3 nodule meets criteria for biopsy as above.

Additional bilateral TR 2 nodules as detailed do not meet criteria
for any biopsy or follow-up.

The above is in keeping with the ACR TI-RADS recommendations - [HOSPITAL] 3016;[DATE].

## 2022-05-04 ENCOUNTER — Encounter: Payer: Self-pay | Admitting: *Deleted

## 2022-05-25 NOTE — Progress Notes (Deleted)
Name: Ashley Bartlett  MRN/ DOB: [id number], 1980-10-19    Age/ Sex: 41 y.o., female    PCP: Leamon Arnt, MD   Reason for Endocrinology Evaluation: MNG     Date of Initial Endocrinology Evaluation: 05/20/2021    HPI: Ms. Ashley Bartlett is a 41 y.o. female with a past medical history of MNG. The patient presented for initial endocrinology clinic visit on 05/20/2021 for consultative assistance with her MNG.   She has been diagnosed with MNG in 2021 during physical exam , which prompted a thyroid ultrasound revealing a left inferior nodule meeting FNA criteria, pt  was busy and did not proceed with FNA    She had a repeat ultrasound in 2022 with the left nodule 4.6 cm meeting FNA criteria    Sister with thyroid cancer  SUBJECTIVE:    Today (@TD @):  Ashley Bartlett is here for a follow upon [place]  She denies local neck symptoms  Denies local neck symptoms  Denies loose stools or diarrhea  Denies hand tremors      HISTORY:  Past Medical History:  Past Medical History:  Diagnosis Date   Anemia    Multinodular goiter 03/08/2020   Enlarged thyroid on exam 02/2020: ultrasound reveals nodules; one large enough to warrant biopsy: referred to endocrine.    Past Surgical History:  Past Surgical History:  Procedure Laterality Date   LIPOSUCTION     [address] [place]    Social History:  reports that she has never smoked. She has never used smokeless tobacco. She reports that she does not drink alcohol and does not use drugs. Family History: family history includes Asthma in her sister; Breast cancer (age of onset: 75) in her sister; Diabetes in her father; Diabetes type I in her daughter; Healthy in her brother, brother, brother, daughter, and sister; Hepatitis C in her mother; Hypertension in her father, mother, and sister.   HOME MEDICATIONS: Allergies as of 05/26/2022   No Known Allergies      Medication List        Accurate as of May 25, 2022  7:09 PM.  If you have any questions, ask your nurse or doctor.          phentermine 37.5 MG tablet Commonly known as: ADIPEX-P Take 1 tablet (37.5 mg total) by mouth daily before breakfast.          REVIEW OF SYSTEMS: A comprehensive ROS was conducted with the patient and is negative except as per HPI    OBJECTIVE:  VS: There were no vitals taken for this visit.   Wt Readings from Last 3 Encounters:  05/21/21 207 lb 9.6 oz (94.2 kg)  05/20/21 210 lb (95.3 kg)  02/21/20 215 lb 9.6 oz (97.8 kg)     EXAM: General: Pt appears well and is in NAD  Eyes: External eye exam normal without stare, lid lag or exophthalmos.  EOM intact.    Neck: General: Supple without adenopathy. Thyroid: Bilateral nodules appreciated   Lungs: Clear with good BS bilat with no rales, rhonchi, or wheezes  Heart: Auscultation: RRR.  Abdomen: Normoactive bowel sounds, soft, nontender, without masses or organomegaly palpable  Extremities:  BL LE: No pretibial edema normal ROM and strength.  Skin: Hair: Texture and amount normal with gender appropriate distribution Skin Inspection: No rashes Skin Palpation: Skin temperature, texture, and thickness normal to palpation  Neuro: Cranial nerves: II - XII grossly intact  Motor: Normal strength throughout DTRs: 2+ and symmetric in  UE without delay in relaxation phase  Mental Status: Judgment, insight: Intact Orientation: Oriented to time, place, and person Mood and affect: No depression, anxiety, or agitation     DATA REVIEWED:   Results for TALMA, LIVELY (MRN [id number]) as of 05/20/2021 13:25  Ref. Range 02/21/2020 13:31  TSH Latest Ref Range: 0.35 - 4.50 uIU/mL 1.44    Thyroid Ultrasound 05/01/2021  Estimated total number of nodules >/= 1 cm: 3   Number of spongiform nodules >/=  2 cm not described below (TR1): 0   Number of mixed cystic and solid nodules >/= 1.5 cm not described below ([place]): 0    _________________________________________________________   Nodule # 1:   Location: Right; Inferior   Maximum size: 2.5, previously 2.1 cm; Other 2 dimensions: 1.6 x 1.5 cm   Composition: mixed cystic and solid (1)   Echogenicity: isoechoic (1)   Shape: not taller-than-wide (0)   Margins: ill-defined (0)   Echogenic foci: none (0)   ACR TI-RADS total points: 2.   ACR TI-RADS risk category: TR2 (2 points).   ACR TI-RADS recommendations:   This nodule does NOT meet TI-RADS criteria for biopsy or dedicated follow-up.   _________________________________________________________   Nodule # 2:   Location: Left; Superior   Maximum size: 2.1, previously 2.0 cm; Other 2 dimensions: 1.8 x 1.2 cm   Composition: mixed cystic and solid (1)   Echogenicity: isoechoic (1)   Shape: not taller-than-wide (0)   Margins: ill-defined (0)   Echogenic foci: none (0)   ACR TI-RADS total points: 2.   ACR TI-RADS risk category: TR2 (2 points).   ACR TI-RADS recommendations:   This nodule does NOT meet TI-RADS criteria for biopsy or dedicated follow-up.   _________________________________________________________   Nodule # 3:   Location: Left; Inferior   Maximum size: 4.6, previously 5.3 cm; Other 2 dimensions: 4.5 x 3.0 cm   Composition: solid/almost completely solid (2)   Echogenicity: isoechoic (1)   Shape: not taller-than-wide (0)   Margins: ill-defined (0)   Echogenic foci: none (0)   ACR TI-RADS total points: 3.   ACR TI-RADS risk category: TR3 (3 points).   ACR TI-RADS recommendations:   **Given size (>/= 2.5 cm) and appearance, fine needle aspiration of this mildly suspicious nodule should be considered based on TI-RADS criteria.   _________________________________________________________   Stable thyroid heterogeneity without hypervascularity or regional adenopathy.   IMPRESSION: 4.6 cm left inferior TR 3 nodule meets criteria for biopsy as  above.   Additional bilateral TR 2 nodules as detailed do not meet criteria for any biopsy or follow-up.    ASSESSMENT/PLAN/RECOMMENDATIONS:   MNG:  - No local neck symptoms  - Labs at PCP  tomorrow  - Discussed increased false positive results of FNA of nodules size > 4 cm  - Will have low threshold for thyroidectomy  - Will proceed with FNA    F/U in 1 yr or sooner pending FNA results   Signed electronically by: Mack Guise, MD  [facility] Endocrinology  [facility] [address] [place], [place] [address] Phone: [phone] FAX: [phone]   CC: Leamon Arnt, [place] [address] Phone: [phone] Fax: [phone]   Return to Endocrinology clinic as below: Future Appointments  Date Time Provider [place]  05/26/2022 10:30 AM Zakyria Metzinger, Melanie Crazier, MD [id number] None

## 2022-05-26 ENCOUNTER — Ambulatory Visit: Payer: Self-pay | Admitting: Internal Medicine

## 2023-02-03 ENCOUNTER — Ambulatory Visit: Payer: Self-pay | Admitting: Family Medicine

## 2023-02-26 ENCOUNTER — Ambulatory Visit: Payer: Self-pay | Admitting: Family Medicine

## 2023-03-25 ENCOUNTER — Encounter (INDEPENDENT_AMBULATORY_CARE_PROVIDER_SITE_OTHER): Payer: Self-pay

## 2023-04-13 ENCOUNTER — Ambulatory Visit: Payer: Self-pay | Admitting: Family Medicine
